# Patient Record
Sex: Male | Born: 1965 | Race: White | Hispanic: No | Marital: Married | Smoking: Former smoker
Health system: Southern US, Community
[De-identification: ages and names within clinical notes are randomized; demographics above are authoritative.]

## PROBLEM LIST (undated history)

## (undated) DIAGNOSIS — K219 Gastro-esophageal reflux disease without esophagitis: Secondary | ICD-10-CM

## (undated) DIAGNOSIS — G4700 Insomnia, unspecified: Secondary | ICD-10-CM

## (undated) DIAGNOSIS — R5382 Chronic fatigue, unspecified: Secondary | ICD-10-CM

## (undated) DIAGNOSIS — Z79891 Long term (current) use of opiate analgesic: Secondary | ICD-10-CM

## (undated) DIAGNOSIS — F419 Anxiety disorder, unspecified: Secondary | ICD-10-CM

## (undated) DIAGNOSIS — I10 Essential (primary) hypertension: Secondary | ICD-10-CM

## (undated) DIAGNOSIS — F32A Depression, unspecified: Secondary | ICD-10-CM

## (undated) DIAGNOSIS — E291 Testicular hypofunction: Secondary | ICD-10-CM

## (undated) DIAGNOSIS — F329 Major depressive disorder, single episode, unspecified: Secondary | ICD-10-CM

## (undated) DIAGNOSIS — M2550 Pain in unspecified joint: Secondary | ICD-10-CM

## (undated) DIAGNOSIS — E785 Hyperlipidemia, unspecified: Secondary | ICD-10-CM

## (undated) DIAGNOSIS — J302 Other seasonal allergic rhinitis: Secondary | ICD-10-CM

## (undated) DIAGNOSIS — M109 Gout, unspecified: Secondary | ICD-10-CM

## (undated) DIAGNOSIS — G8929 Other chronic pain: Secondary | ICD-10-CM

## (undated) DIAGNOSIS — M5440 Lumbago with sciatica, unspecified side: Secondary | ICD-10-CM

## (undated) DIAGNOSIS — G894 Chronic pain syndrome: Secondary | ICD-10-CM

## (undated) DIAGNOSIS — M1990 Unspecified osteoarthritis, unspecified site: Secondary | ICD-10-CM

## (undated) HISTORY — DX: Gastro-esophageal reflux disease without esophagitis: K21.9

## (undated) HISTORY — DX: Depression, unspecified: F32.A

## (undated) HISTORY — DX: Unspecified osteoarthritis, unspecified site: M19.90

## (undated) HISTORY — DX: Insomnia, unspecified: G47.00

## (undated) HISTORY — DX: Long term (current) use of opiate analgesic: Z79.891

## (undated) HISTORY — PX: CHOLECYSTECTOMY: SHX55

## (undated) HISTORY — DX: Major depressive disorder, single episode, unspecified: F32.9

## (undated) HISTORY — DX: Other seasonal allergic rhinitis: J30.2

## (undated) HISTORY — DX: Chronic fatigue, unspecified: R53.82

## (undated) HISTORY — DX: Anxiety disorder, unspecified: F41.9

## (undated) HISTORY — DX: Gout, unspecified: M10.9

## (undated) HISTORY — DX: Lumbago with sciatica, unspecified side: M54.40

## (undated) HISTORY — DX: Essential (primary) hypertension: I10

## (undated) HISTORY — DX: Hyperlipidemia, unspecified: E78.5

## (undated) HISTORY — DX: Other chronic pain: G89.29

## (undated) HISTORY — DX: Chronic pain syndrome: G89.4

## (undated) HISTORY — DX: Testicular hypofunction: E29.1

## (undated) HISTORY — DX: Pain in unspecified joint: M25.50

---

## 1998-12-01 ENCOUNTER — Emergency Department (HOSPITAL_COMMUNITY): Admission: EM | Admit: 1998-12-01 | Discharge: 1998-12-01 | Payer: Self-pay | Admitting: Emergency Medicine

## 1998-12-01 ENCOUNTER — Encounter: Payer: Self-pay | Admitting: Emergency Medicine

## 2014-06-16 HISTORY — PX: CHOLECYSTECTOMY: SHX55

## 2018-01-16 ENCOUNTER — Emergency Department (HOSPITAL_COMMUNITY): Payer: No Typology Code available for payment source

## 2018-01-16 ENCOUNTER — Encounter (HOSPITAL_COMMUNITY): Payer: Self-pay | Admitting: Emergency Medicine

## 2018-01-16 ENCOUNTER — Emergency Department (HOSPITAL_COMMUNITY)
Admission: EM | Admit: 2018-01-16 | Discharge: 2018-01-17 | Disposition: A | Discharge status: Home / Self Care | Payer: No Typology Code available for payment source | Attending: Emergency Medicine | Admitting: Emergency Medicine

## 2018-01-16 DIAGNOSIS — Y999 Unspecified external cause status: Secondary | ICD-10-CM | POA: Insufficient documentation

## 2018-01-16 DIAGNOSIS — Y929 Unspecified place or not applicable: Secondary | ICD-10-CM | POA: Diagnosis not present

## 2018-01-16 DIAGNOSIS — S060X1A Concussion with loss of consciousness of 30 minutes or less, initial encounter: Secondary | ICD-10-CM | POA: Insufficient documentation

## 2018-01-16 DIAGNOSIS — Y9389 Activity, other specified: Secondary | ICD-10-CM | POA: Diagnosis not present

## 2018-01-16 DIAGNOSIS — R410 Disorientation, unspecified: Secondary | ICD-10-CM | POA: Diagnosis present

## 2018-01-16 DIAGNOSIS — E876 Hypokalemia: Secondary | ICD-10-CM | POA: Insufficient documentation

## 2018-01-16 LAB — CBC
HCT: 45.1 % (ref 39.0–52.0)
Hemoglobin: 15.2 g/dL (ref 13.0–17.0)
MCH: 30.1 pg (ref 26.0–34.0)
MCHC: 33.7 g/dL (ref 30.0–36.0)
MCV: 89.3 fL (ref 78.0–100.0)
PLATELETS: 164 10*3/uL (ref 150–400)
RBC: 5.05 MIL/uL (ref 4.22–5.81)
RDW: 13.1 % (ref 11.5–15.5)
WBC: 7.4 10*3/uL (ref 4.0–10.5)

## 2018-01-16 LAB — COMPREHENSIVE METABOLIC PANEL
ALT: 17 U/L (ref 0–44)
AST: 19 U/L (ref 15–41)
Albumin: 3.9 g/dL (ref 3.5–5.0)
Alkaline Phosphatase: 41 U/L (ref 38–126)
Anion gap: 9 (ref 5–15)
BUN: 7 mg/dL (ref 6–20)
CO2: 28 mmol/L (ref 22–32)
CREATININE: 1.02 mg/dL (ref 0.61–1.24)
Calcium: 9.3 mg/dL (ref 8.9–10.3)
Chloride: 103 mmol/L (ref 98–111)
GFR calc Af Amer: >60 mL/min (ref >60)
Glucose, Bld: 108 mg/dL — ABNORMAL HIGH (ref 70–99)
Potassium: 2.7 mmol/L — CL (ref 3.5–5.1)
SODIUM: 140 mmol/L (ref 135–145)
TOTAL PROTEIN: 6.7 g/dL (ref 6.5–8.1)
Total Bilirubin: 1.3 mg/dL — ABNORMAL HIGH (ref 0.3–1.2)

## 2018-01-16 LAB — I-STAT CHEM 8, ED
BUN: 6 mg/dL (ref 6–20)
Calcium, Ion: 1.12 mmol/L — ABNORMAL LOW (ref 1.15–1.40)
Chloride: 102 mmol/L (ref 98–111)
Creatinine, Ser: 1 mg/dL (ref 0.61–1.24)
Glucose, Bld: 100 mg/dL — ABNORMAL HIGH (ref 70–99)
HCT: 44 % (ref 39.0–52.0)
HEMOGLOBIN: 15 g/dL (ref 13.0–17.0)
POTASSIUM: 2.7 mmol/L — AB (ref 3.5–5.1)
Sodium: 140 mmol/L (ref 135–145)
TCO2: 26 mmol/L (ref 22–32)

## 2018-01-16 LAB — PROTIME-INR
INR: 1.05
PROTHROMBIN TIME: 13.6 s (ref 11.4–15.2)

## 2018-01-16 LAB — SAMPLE TO BLOOD BANK

## 2018-01-16 LAB — I-STAT CG4 LACTIC ACID, ED: LACTIC ACID, VENOUS: 0.9 mmol/L (ref 0.5–1.9)

## 2018-01-16 LAB — ETHANOL: Alcohol, Ethyl (B): <10 mg/dL (ref <10)

## 2018-01-16 MED ORDER — SODIUM CHLORIDE 0.9 % IV SOLN
INTRAVENOUS | Status: AC | PRN
  Administered 2018-01-16: 1000 mL via INTRAVENOUS

## 2018-01-16 MED ORDER — IOHEXOL 300 MG/ML  SOLN
100.0000 mL | Freq: Once | INTRAMUSCULAR | Status: AC | PRN
  Administered 2018-01-16: 100 mL via INTRAVENOUS
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
## 2018-01-16 NOTE — ED Notes (Signed)
Patient transported to CT scan . 
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
## 2018-01-16 NOTE — ED Notes (Signed)
I-Stat Chem 8 abnormal results Potassium of 2.7 reported to Dr. Jacqulyn BathLong
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
## 2018-01-17 ENCOUNTER — Emergency Department (HOSPITAL_COMMUNITY): Payer: No Typology Code available for payment source

## 2018-01-17 DIAGNOSIS — S060X1A Concussion with loss of consciousness of 30 minutes or less, initial encounter: Secondary | ICD-10-CM | POA: Diagnosis not present

## 2018-01-17 LAB — URINALYSIS, ROUTINE W REFLEX MICROSCOPIC
BILIRUBIN URINE: NEGATIVE
Glucose, UA: NEGATIVE mg/dL
Hgb urine dipstick: NEGATIVE
KETONES UR: NEGATIVE mg/dL
Leukocytes, UA: NEGATIVE
NITRITE: NEGATIVE
PROTEIN: NEGATIVE mg/dL
Specific Gravity, Urine: 1.02 (ref 1.005–1.030)
pH: 7 (ref 5.0–8.0)

## 2018-01-17 LAB — CDS SEROLOGY

## 2018-01-17 MED ORDER — CYCLOBENZAPRINE HCL 5 MG PO TABS: 5.0000 mg | ORAL_TABLET | Freq: Two times a day (BID) | ORAL | 0 refills | Status: DC | PRN

## 2018-01-17 MED ORDER — ONDANSETRON 4 MG PO TBDP
4.0000 mg | ORAL_TABLET | Freq: Once | ORAL | Status: AC
  Administered 2018-01-17: 4 mg via ORAL
  Filled 2018-01-17: qty 1

## 2018-01-17 MED ORDER — IBUPROFEN 400 MG PO TABS
600.0000 mg | ORAL_TABLET | Freq: Once | ORAL | Status: AC
  Administered 2018-01-17: 600 mg via ORAL
  Filled 2018-01-17: qty 1

## 2018-01-17 MED ORDER — POTASSIUM CHLORIDE 10 MEQ/100ML IV SOLN
10.0000 meq | Freq: Once | INTRAVENOUS | Status: AC
  Administered 2018-01-17: 10 meq via INTRAVENOUS
  Filled 2018-01-17: qty 100

## 2018-01-17 MED ORDER — NAPROXEN 500 MG PO TABS: 500.0000 mg | ORAL_TABLET | Freq: Two times a day (BID) | ORAL | 0 refills | Status: DC

## 2018-01-17 MED ORDER — POTASSIUM CHLORIDE CRYS ER 20 MEQ PO TBCR
40.0000 meq | EXTENDED_RELEASE_TABLET | Freq: Once | ORAL | Status: AC
  Administered 2018-01-17: 40 meq via ORAL
  Filled 2018-01-17: qty 2

## 2018-01-17 MED ORDER — POTASSIUM CHLORIDE ER 20 MEQ PO TBCR
20.0000 meq | EXTENDED_RELEASE_TABLET | Freq: Every day | ORAL | 0 refills | Status: DC
Start: 2018-01-17 — End: 2021-01-28

## 2018-01-17 NOTE — ED Notes (Signed)
Patient able to ambulate to the nurses station and back. Upon returning to the room he became slightly dizzy but was able to keep his balance. MD and RN aware.

## 2018-01-17 NOTE — ED Provider Notes (Signed)
Patient signed out pending follow-up of imaging studies.  Imaging is negative.  Patient does continue to act confused.  Suspect concussion.  He is otherwise nonfocal.  Was noted to have a low potassium.  This was replaced.  He was able to ambulate and tolerate fluids without difficulty.  Will discharge home with naproxen and Flexeril.  He was given concussion follow-up.  He was also given supplemental potassium and informed to follow-up with his primary doctor for recheck.   Shon BatonHorton, Jenny Lai F, MD 01/17/18 [phone]

## 2018-01-17 NOTE — ED Notes (Signed)
Pt in bathroom at this time.

## 2018-01-17 NOTE — ED Notes (Signed)
Patient given a [place] sandwich and drink per MD. Tolerated well.

## 2018-01-17 NOTE — ED Notes (Signed)
Patient urinated in urinal for UA collection, however family member poured it out before staff could collect the sample.
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
## 2018-06-10 DIAGNOSIS — R079 Chest pain, unspecified: Secondary | ICD-10-CM

## 2018-07-06 ENCOUNTER — Encounter: Payer: Self-pay | Admitting: *Deleted

## 2018-07-06 ENCOUNTER — Telehealth: Payer: Self-pay | Admitting: Diagnostic Neuroimaging

## 2018-07-06 ENCOUNTER — Ambulatory Visit: Payer: Medicaid Other | Admitting: Diagnostic Neuroimaging

## 2018-07-06 ENCOUNTER — Encounter: Payer: Self-pay | Admitting: Diagnostic Neuroimaging

## 2018-07-06 VITALS — BP 152/76 | HR 91 | Ht 71.0 in | Wt 168.4 lb

## 2018-07-06 DIAGNOSIS — R251 Tremor, unspecified: Secondary | ICD-10-CM

## 2018-07-06 DIAGNOSIS — F0781 Postconcussional syndrome: Secondary | ICD-10-CM | POA: Diagnosis not present
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
## 2018-07-06 NOTE — Telephone Encounter (Signed)
Medicaid order sent to GI lvm for pt to be aware. I left GI phone number of [phone] and to give them a call if he has not heard from them in the next 2-3 business days.

## 2018-07-06 NOTE — Patient Instructions (Signed)
  POST-CONCUSSION SYNDROME - optimize nutrition, exercise, sleep and relaxation techniques  TREMORS / SYNCOPE - check MRI brain and EEG

## 2018-07-12 ENCOUNTER — Other Ambulatory Visit: Payer: Medicaid Other

## 2018-07-12 ENCOUNTER — Encounter: Payer: Self-pay | Admitting: Diagnostic Neuroimaging

## 2018-07-12 NOTE — Telephone Encounter (Signed)
Medicaid did not approve the MRI.Marland Kitchen  "Based on [employer] head imaging guidelines section: Movement disorders , we cannot approve this request. Your recorders show that you have a condition of that affects your body movements. The reason this request cannot be approved is because 1. guidelines do not generally support imaging int he majority of movement disorders including any of the following essential tremor typical parkinson's disease tics spasms tremors of anxiety or weakness. Restless leg syndrome in the absence of atypical or unusual features."   There is an option for a peer to peer their number is [phone] and the case number is [id number] and it has to be scheduled by Friday 07/16/2018.

## 2018-07-14 ENCOUNTER — Inpatient Hospital Stay: Admission: RE | Admit: 2018-07-14 | Payer: Medicaid Other | Source: Ambulatory Visit

## 2018-07-16 NOTE — Telephone Encounter (Addendum)
Called [employer] to schedule a peer to peer, [phone], case number is [id number]. Spoke with Cyndie Chime, who gave available peer to peer times, it must be done today. Dr Marjory Lies not available those times. She advised he can talk to RN. Ruben Gottron, RN on line to speak with Dr Marjory Lies. Call was dropped. Dr Marjory Lies will attempt peer to peer or if unable, test will be canceled.

## 2018-07-16 NOTE — Telephone Encounter (Signed)
My nurse called number to give addl clinical info. Call was transferred to me, but then call dropped. I called number back myself. I was placed on hold for 20 minutes. No answer.   Scheduled call for Monday at 12:15p.   Suanne Marker, MD 07/16/2018, 12:52 PM Certified in Neurology, Neurophysiology and Neuroimaging  [facility] [address] [place], [place] [address] [phone]

## 2018-07-19 ENCOUNTER — Telehealth: Payer: Self-pay | Admitting: *Deleted

## 2018-07-19 DIAGNOSIS — R251 Tremor, unspecified: Secondary | ICD-10-CM

## 2018-07-19 DIAGNOSIS — F0781 Postconcussional syndrome: Secondary | ICD-10-CM

## 2018-07-19 NOTE — Telephone Encounter (Signed)
Called [employer] to reschedule MRI brain peer to peer, spoke with Roxanne F. Case number is [id number].  Roxanne stated it cannot be completed even with peer to peer because it is outside the window date since the denial. She stated the case can be restarted all over for approval. A new case number will be assigned at that time.  Per Dr Marjory Lies, he would like Irving Burton, MRI coordinator to resubmit it a week from today. This note sent to [facility].

## 2018-07-21 NOTE — Telephone Encounter (Signed)
Started new case w/[employer].

## 2018-07-26 ENCOUNTER — Telehealth: Payer: Self-pay | Admitting: Diagnostic Neuroimaging

## 2018-07-26 NOTE — Telephone Encounter (Signed)
The new case has been denied. The phone number for the peer to peer is [phone] and the case number is [id number]. Needs to be scheduled by 07/30/18.

## 2018-07-26 NOTE — Telephone Encounter (Signed)
[place] [employer]   Called [place], spoke with Cheri Guppy to schedule peer to peer for case number is [id number]. She stated either a reconsideration and /or peer 2 peer can be done. If peer 2 peer is  done 1st and denied, a reconsideration cannot be done.  Reconsideration can be by phone or fax with new information. I advised will discuss with Dr Marjory Lies. The physician support P [phone] opt 4 for either option.

## 2018-07-26 NOTE — Telephone Encounter (Signed)
Evaluate post-concussion syndrome, memory loss, tremors and headaches. Symptoms worsening over time. -VRP

## 2018-07-26 NOTE — Telephone Encounter (Signed)
Pt is wanting to know the status of scheduling the MRI. Please call to advise

## 2018-07-26 NOTE — Telephone Encounter (Addendum)
Travis Elliott , spoke with Glade Nurse, clinical reviewer. I gave her additional information. She stated they had all information except post concussion syndrome and headache. She listed all again, stated it would go for reconsideration with medical review. Office will be notified via call or fax.  Case # [id number]

## 2018-07-27 NOTE — Telephone Encounter (Signed)
Patient called in this morning. I relayed the message to Jael to inform him that it is still in the process of being appealed at the moment. Jael relayed this message to him.

## 2018-07-29 NOTE — Addendum Note (Signed)
Addended by: [place] Pink on: 07/29/2018 08:48 AM   Modules accepted: Orders

## 2018-07-29 NOTE — Telephone Encounter (Signed)
The MRI has finally been approved Auth: [id number] (exp. 07/21/18 to 08/20/18). When you get a chance can you put a new order in so it can have a new accession number for GI to r/s.

## 2018-07-29 NOTE — Telephone Encounter (Signed)
New MRI brain w/wo order placed.

## 2018-07-29 NOTE — Addendum Note (Signed)
Addended by: Joycelyn Schmid R on: 07/29/2018 10:07 AM   Modules accepted: Orders

## 2018-08-04 ENCOUNTER — Ambulatory Visit: Payer: Medicaid Other | Admitting: Diagnostic Neuroimaging

## 2018-08-04 DIAGNOSIS — R251 Tremor, unspecified: Secondary | ICD-10-CM

## 2018-08-06 ENCOUNTER — Ambulatory Visit
Admission: RE | Admit: 2018-08-06 | Discharge: 2018-08-06 | Disposition: A | Discharge status: Home / Self Care | Payer: Medicaid Other | Source: Ambulatory Visit | Attending: Diagnostic Neuroimaging | Admitting: Diagnostic Neuroimaging

## 2018-08-06 DIAGNOSIS — F0781 Postconcussional syndrome: Secondary | ICD-10-CM | POA: Diagnosis not present

## 2018-08-06 DIAGNOSIS — R251 Tremor, unspecified: Secondary | ICD-10-CM

## 2018-08-06 MED ORDER — GADOBENATE DIMEGLUMINE 529 MG/ML IV SOLN
15.0000 mL | Freq: Once | INTRAVENOUS | Status: AC | PRN
  Administered 2018-08-06: 15 mL via INTRAVENOUS

## 2018-08-10 ENCOUNTER — Telehealth: Payer: Self-pay | Admitting: *Deleted

## 2018-08-10 NOTE — Telephone Encounter (Signed)
Spoke with patient and informed him his MRI brain results are unremarkable. I advised when EEG results are available he'll get a call. Patient verbalized understanding, appreciation.

## 2018-08-13 NOTE — Procedures (Signed)
   [facility]  EEG (ELECTROENCEPHALOGRAM) REPORT   STUDY DATE: 08/04/18 PATIENT NAME: Travis Elliott DOB: Sep 04, 1965 MRN: [id number]  ORDERING CLINICIAN: Joycelyn Schmid, MD   TECHNOLOGIST: Charlett Blake TECHNIQUE: Electroencephalogram was recorded utilizing standard 10-20 system of lead placement and reformatted into average and bipolar montages.  RECORDING TIME: 23 minutes  ACTIVATION: hyperventilation and photic stimulation  CLINICAL INFORMATION: 53 year old male with tremors.  FINDINGS: Posterior dominant background rhythms, which attenuate with eye opening, ranging 9-10 hertz and 30-40 microvolts. No focal, lateralizing, epileptiform activity or seizures are seen. Patient recorded in the awake and drowsy state. EKG channel shows regular rhythm of 75-80 beats per minute.   IMPRESSION:   Normal EEG in the awake and drowsy states.      INTERPRETING PHYSICIAN:  Suanne Marker, MD Certified in Neurology, Neurophysiology and Neuroimaging  [facility] [address], [address] [place], [place] [address] [phone]

## 2018-08-16 ENCOUNTER — Telehealth: Payer: Self-pay | Admitting: *Deleted

## 2018-08-16 NOTE — Telephone Encounter (Signed)
Spoke with patient and informed him his EEG was normal. He asked why he is "shaking". I advised Dr Marjory Lies has ruled out other causes of tremors with MRI, EEG. I advised effects of concussion can last for months. I advised he monitor his symptoms and call for any worsening symptoms, concerns prior to FU in Aug. Advised he can be seen sooner as needed. He verbalized understanding, appreciation.

## 2018-11-03 DIAGNOSIS — Z0271 Encounter for disability determination: Secondary | ICD-10-CM

## 2019-01-26 ENCOUNTER — Telehealth: Payer: Self-pay | Admitting: *Deleted

## 2019-01-26 ENCOUNTER — Ambulatory Visit: Payer: Medicaid Other | Admitting: Diagnostic Neuroimaging

## 2019-01-26 NOTE — Telephone Encounter (Signed)
Patient was no show for follow up today. 

## 2019-01-27 ENCOUNTER — Encounter: Payer: Self-pay | Admitting: Diagnostic Neuroimaging
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
## 2020-12-14 DIAGNOSIS — R569 Unspecified convulsions: Secondary | ICD-10-CM

## 2020-12-14 HISTORY — DX: Unspecified convulsions: R56.9

## 2021-01-23 ENCOUNTER — Encounter: Payer: Self-pay | Admitting: *Deleted

## 2021-01-28 ENCOUNTER — Ambulatory Visit: Payer: Medicaid Other | Admitting: Diagnostic Neuroimaging

## 2021-01-28 ENCOUNTER — Other Ambulatory Visit: Payer: Self-pay

## 2021-01-28 ENCOUNTER — Encounter: Payer: Self-pay | Admitting: Diagnostic Neuroimaging

## 2021-01-28 VITALS — BP 139/86 | HR 82 | Ht 66.0 in | Wt 182.8 lb

## 2021-01-28 DIAGNOSIS — R4689 Other symptoms and signs involving appearance and behavior: Secondary | ICD-10-CM | POA: Diagnosis not present
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
## 2021-01-28 NOTE — Patient Instructions (Signed)
  ABNORMAL SPELL (syncope vs seizure; severe stress factors) - hold off on anti-seizure meds (only 1 event; could have been triggered by high stress levels; also could have been related to withdrawal from xanax (UDS was negative in hospital)  - According to [place] law, you can not drive unless you are seizure / syncope free for at least 6 months and under physician's care.   - Please maintain precautions. Do not participate in activities where a loss of awareness could harm you or someone else. No swimming alone, no tub bathing, no hot tubs, no driving, no operating motorized vehicles (cars, ATVs, motocycles, etc), lawnmowers, power tools or firearms. No standing at heights, such as rooftops, ladders or stairs. Avoid hot objects such as stoves, heaters, open fires. Wear a helmet when riding a bicycle, scooter, skateboard, etc. and avoid areas of traffic. Set your water heater to 120 degrees or less.
